# Patient Record
Sex: Female | Born: 1945 | Race: White | Hispanic: No | Smoking: Current every day smoker
Health system: Southern US, Community
[De-identification: ages and names within clinical notes are randomized; demographics above are authoritative.]

## PROBLEM LIST (undated history)

## (undated) HISTORY — PX: ABDOMINAL HYSTERECTOMY: SHX81

## (undated) HISTORY — PX: ORIF WRIST FRACTURE: SHX2133

---

## 2017-07-14 ENCOUNTER — Other Ambulatory Visit: Payer: Self-pay

## 2017-07-14 ENCOUNTER — Emergency Department (HOSPITAL_COMMUNITY)
Admission: EM | Admit: 2017-07-14 | Discharge: 2017-07-15 | Disposition: A | Discharge status: Home / Self Care | Payer: Medicare Other | Attending: Emergency Medicine | Admitting: Emergency Medicine

## 2017-07-14 ENCOUNTER — Emergency Department (HOSPITAL_COMMUNITY): Payer: Medicare Other

## 2017-07-14 ENCOUNTER — Encounter (HOSPITAL_COMMUNITY): Payer: Self-pay | Admitting: *Deleted

## 2017-07-14 DIAGNOSIS — Y9389 Activity, other specified: Secondary | ICD-10-CM | POA: Insufficient documentation

## 2017-07-14 DIAGNOSIS — Y998 Other external cause status: Secondary | ICD-10-CM | POA: Insufficient documentation

## 2017-07-14 DIAGNOSIS — W01198A Fall on same level from slipping, tripping and stumbling with subsequent striking against other object, initial encounter: Secondary | ICD-10-CM | POA: Diagnosis not present

## 2017-07-14 DIAGNOSIS — S5292XA Unspecified fracture of left forearm, initial encounter for closed fracture: Secondary | ICD-10-CM

## 2017-07-14 DIAGNOSIS — S6992XA Unspecified injury of left wrist, hand and finger(s), initial encounter: Secondary | ICD-10-CM | POA: Diagnosis present

## 2017-07-14 DIAGNOSIS — F17200 Nicotine dependence, unspecified, uncomplicated: Secondary | ICD-10-CM | POA: Insufficient documentation

## 2017-07-14 DIAGNOSIS — Y92002 Bathroom of unspecified non-institutional (private) residence single-family (private) house as the place of occurrence of the external cause: Secondary | ICD-10-CM | POA: Diagnosis not present

## 2017-07-14 MED ORDER — ACETAMINOPHEN 500 MG PO TABS
1000.0000 mg | ORAL_TABLET | Freq: Once | ORAL | Status: AC
  Administered 2017-07-14: 1000 mg via ORAL
  Filled 2017-07-14: qty 2

## 2017-07-14 NOTE — ED Provider Notes (Signed)
[facility] EMERGENCY DEPARTMENT Provider Note   CSN: [id number] Arrival date & time: 07/14/17  2207     History   Chief Complaint Chief Complaint  Patient presents with  . Fall    HPI Karla Christensen is a 72 y.o. female.  HPI  Patient presents after a fall in the shower.  States she just slipped and landed on her left wrist.  States the wrist was pointing out in the wrong direction.  Skin intact.  No numbness or weakness.  No elbow or shoulder pain.  Previously operated on his wrist 20 years ago.   History reviewed. No pertinent past medical history.  There are no active problems to display for this patient.   History reviewed.  Left wrist surgery around 25 years ago by someone in [place]   [place] History   None      Home Medications    Prior to Admission medications   Medication Sig Start Date End Date Taking? Authorizing Provider  ibuprofen (ADVIL,MOTRIN) 200 MG tablet Take 400 mg by mouth 2 (two) times daily as needed for mild pain or moderate pain.   Yes [provider]    Family History No family history on file.  Social History Social History   Tobacco Use  . Smoking status: Current Every Day Smoker  . Smokeless tobacco: Never Used  Substance Use Topics  . Alcohol use: Not Currently  . Drug use: Not on file     Allergies   Codeine   Review of Systems Review of Systems  Constitutional: Negative for appetite change.  Cardiovascular: Negative for chest pain.  Gastrointestinal: Negative for abdominal pain.  Musculoskeletal:       Left forearm pain and deformity  Skin: Negative for rash.  Neurological: Negative for weakness.     Physical Exam Updated Vital Signs BP 137/75   Pulse 98   Temp 97.8 F (36.6 C) (Oral)   Resp 20   Ht  (1.727 m)   Wt 100.2 kg (221 lb)   SpO2 97%   BMI 33.60 kg/m   Physical Exam  Constitutional: She appears well-developed.  HENT:  Head: Atraumatic.  Neck: Neck supple.    Cardiovascular: Normal rate.  Musculoskeletal: She exhibits tenderness.  Tenderness and mild deformity over left mid forearm.  No tenderness over elbow.  No tenderness over shoulder or wrist.  Neurovascular intact in left hand and has strong radial pulse.  Neurological: She is alert.  Skin: Skin is warm.     ED Treatments / Results  Labs (all labs ordered are listed, but only abnormal results are displayed) Labs Reviewed - No data to display  EKG None  Radiology Dg Forearm Left  Result Date: 07/14/2017 CLINICAL DATA:  Left forearm pain after slip and fall in the shower. EXAM: LEFT FOREARM - 2 VIEW COMPARISON:  None. FINDINGS: Acute, closed, transverse fracture through the mid ulnar diaphysis with 1 shaft width volar displacement of the distal fracture fragment and minimal comminution noted. Slight ulnar angulation of the distal fracture fragment is also seen. A nondisplaced transverse fracture through the distal radius at the junction of the middle and distal third, approximately 18 mm from the proximal margin of a volar fixation plate is also noted. No hardware loosening is apparent. No joint dislocations. IMPRESSION: Acute, closed, fractures of the mid ulna with 1 shaft with volar displacement of the distal fracture fragment and slight ulnar angulation. Similarly there is a fracture involving the diaphysis of the distal radius  at the junction of the proximal middle third with slight ulnar angulation of the distal fracture fragment. A pre-existing volar plate and screw fixation is noted of the distal radius without hardware failure nor fracture involvement identified. Electronically Signed   By: Tollie Eth M.D.   On: 07/14/2017 22:52    Procedures Procedures (including critical care time)  Medications Ordered in ED Medications  acetaminophen (TYLENOL) tablet 1,000 mg (1,000 mg Oral Given 07/14/17 2320)     Initial Impression / Assessment and Plan / ED Course  I have reviewed the  triage vital signs and the nursing notes.  Pertinent labs & imaging results that were available during my care of the patient were reviewed by me and considered in my medical decision making (see chart for details).     Patient with left forearm fracture after fall.  Skin intact.  Discussed with Dr. Romeo Apple, who will see the patient in consult tomorrow.  Discharge home after mobilization.  Final Clinical Impressions(s) / ED Diagnoses   Final diagnoses:  Closed fracture of left forearm, initial encounter    ED Discharge Orders    None       Benjiman Core, MD 07/14/17 2349

## 2017-07-14 NOTE — ED Triage Notes (Signed)
Pt states that slipped in the shower, landing on her left forearm, pt c/o pain to left forearm with deformity noted, pt arrived to er in splint, cms intact distal,

## 2017-07-14 NOTE — ED Notes (Signed)
Ice pack applied to left forearm,

## 2017-07-15 ENCOUNTER — Encounter (HOSPITAL_COMMUNITY): Payer: Self-pay

## 2017-07-15 ENCOUNTER — Encounter (HOSPITAL_COMMUNITY)
Admission: RE | Admit: 2017-07-15 | Discharge: 2017-07-15 | Disposition: A | Discharge status: Home / Self Care | Payer: Medicare Other | Source: Ambulatory Visit | Attending: Orthopedic Surgery | Admitting: Orthopedic Surgery

## 2017-07-15 ENCOUNTER — Other Ambulatory Visit: Payer: Self-pay

## 2017-07-15 ENCOUNTER — Ambulatory Visit (INDEPENDENT_AMBULATORY_CARE_PROVIDER_SITE_OTHER): Payer: Medicare Other | Admitting: Orthopedic Surgery

## 2017-07-15 VITALS — BP 147/82 | HR 86 | Ht 68.0 in | Wt 218.0 lb

## 2017-07-15 DIAGNOSIS — S52302A Unspecified fracture of shaft of left radius, initial encounter for closed fracture: Secondary | ICD-10-CM | POA: Diagnosis not present

## 2017-07-15 DIAGNOSIS — Y92002 Bathroom of unspecified non-institutional (private) residence single-family (private) house as the place of occurrence of the external cause: Secondary | ICD-10-CM | POA: Diagnosis not present

## 2017-07-15 DIAGNOSIS — S52202A Unspecified fracture of shaft of left ulna, initial encounter for closed fracture: Secondary | ICD-10-CM

## 2017-07-15 DIAGNOSIS — Y93E1 Activity, personal bathing and showering: Secondary | ICD-10-CM | POA: Diagnosis not present

## 2017-07-15 DIAGNOSIS — Z885 Allergy status to narcotic agent status: Secondary | ICD-10-CM | POA: Diagnosis not present

## 2017-07-15 DIAGNOSIS — Z01818 Encounter for other preprocedural examination: Secondary | ICD-10-CM

## 2017-07-15 DIAGNOSIS — F17210 Nicotine dependence, cigarettes, uncomplicated: Secondary | ICD-10-CM | POA: Diagnosis not present

## 2017-07-15 LAB — CBC WITH DIFFERENTIAL/PLATELET
BASOS ABS: 0 10*3/uL (ref 0.0–0.1)
Basophils Relative: 0 %
Eosinophils Absolute: 0.1 10*3/uL (ref 0.0–0.7)
Eosinophils Relative: 1 %
HEMATOCRIT: 45.2 % (ref 36.0–46.0)
Hemoglobin: 14.3 g/dL (ref 12.0–15.0)
LYMPHS ABS: 2.3 10*3/uL (ref 0.7–4.0)
Lymphocytes Relative: 25 %
MCH: 28.8 pg (ref 26.0–34.0)
MCHC: 31.6 g/dL (ref 30.0–36.0)
MCV: 91.1 fL (ref 78.0–100.0)
Monocytes Absolute: 0.7 10*3/uL (ref 0.1–1.0)
Monocytes Relative: 7 %
NEUTROS ABS: 6.1 10*3/uL (ref 1.7–7.7)
Neutrophils Relative %: 67 %
Platelets: 250 10*3/uL (ref 150–400)
RBC: 4.96 MIL/uL (ref 3.87–5.11)
RDW: 13.3 % (ref 11.5–15.5)
WBC: 9.1 10*3/uL (ref 4.0–10.5)

## 2017-07-15 LAB — COMPREHENSIVE METABOLIC PANEL
ALK PHOS: 107 U/L (ref 38–126)
ALT: 12 U/L — AB (ref 14–54)
AST: 12 U/L — AB (ref 15–41)
Albumin: 4 g/dL (ref 3.5–5.0)
Anion gap: 7 (ref 5–15)
BILIRUBIN TOTAL: 1.3 mg/dL — AB (ref 0.3–1.2)
BUN: 13 mg/dL (ref 6–20)
CALCIUM: 9 mg/dL (ref 8.9–10.3)
CO2: 21 mmol/L — ABNORMAL LOW (ref 22–32)
CREATININE: 0.83 mg/dL (ref 0.44–1.00)
Chloride: 111 mmol/L (ref 101–111)
GFR calc Af Amer: >60 mL/min (ref >60)
GFR calc non Af Amer: >60 mL/min (ref >60)
Glucose, Bld: 104 mg/dL — ABNORMAL HIGH (ref 65–99)
Potassium: 4 mmol/L (ref 3.5–5.1)
Sodium: 139 mmol/L (ref 135–145)
TOTAL PROTEIN: 7.2 g/dL (ref 6.5–8.1)

## 2017-07-15 NOTE — Patient Instructions (Signed)
Karla Christensen  07/15/2017     @   Your procedure is scheduled on  07/16/2017   Report to [facility] at  1100   A.M.  Call this number if you have problems the morning of surgery:  [phone]   Remember:  Do not eat food or drink liquids after midnight.  Take these medicines the morning of surgery with A SIP OF WATER  None   Do not wear jewelry, make-up or nail polish.  Do not wear lotions, powders, or perfumes, or deodorant.  Do not shave 48 hours prior to surgery.  Men may shave face and neck.  Do not bring valuables to the hospital.  [facility] is not responsible for any belongings or valuables.  Contacts, dentures or bridgework may not be worn into surgery.  Leave your suitcase in the car.  After surgery it may be brought to your room.  For patients admitted to the hospital, discharge time will be determined by your treatment team.  Patients discharged the day of surgery will not be allowed to drive home.   Name and phone number of your driver:   family Special instructions:  None  Please read over the following fact sheets that you were given. Pain Booklet, Coughing and Deep Breathing, Anesthesia Post-op Instructions and Care and Recovery After Surgery       Radial Head Fracture A radial head fracture is a break (fracture) in the smaller bone in your forearm (radius). There are two bones in your forearm. The radius, or radial bone, is the bone on the side of your thumb. The break is at the head of the bone, which is at the elbow joint. These breaks usually happen because of an injury, such as falling on your arm when you are reaching out with it. Follow these instructions at home: If you have a splint or sling:  Wear the splint or sling as told by your doctor. Remove it only as told by your doctor.  Loosen the splint if your fingers tingle, become numb, or turn cold and blue.  Keep the splint or sling clean and dry. If you have a  cast:  Do not stick anything inside the cast to scratch your skin.  Check the skin around the cast every day. Tell your doctor about any concerns.  You may put lotion on dry skin around the edges of the cast. Do not put lotion on the skin under the cast. Bathing  Do not take baths, swim, or use a hot tub until your doctor says it is okay. Ask your doctor if you can take showers. You may only be allowed to take sponge baths for bathing.  If your doctor says it is okay for you to take baths or showers, you may need to cover the cast or splint with a plastic bag to protect it from water. Managing pain, stiffness, and swelling  If directed, put ice on the injured area. ? Put ice in a plastic bag. ? Place a towel between your skin and the bag. ? Leave the ice on for 20 minutes, 2-3 times a day.  Move your fingers often to avoid stiffness and to lessen swelling.  Raise (elevate) the injured area above the level of your heart while you are sitting or lying down. Driving  Do not drive or use heavy machinery while taking prescription pain medicine.  Ask your doctor when it is safe to drive if you have  a cast, splint, or sling on your arm. Activity  Return to your normal activities as told by your doctor. Ask your doctor what activities are safe for you.  Do exercises as told by your doctor or physical therapist (PT). General instructions  Take over-the-counter and prescription medicines only as told by your doctor.  Do not put pressure on any part of the cast or splint until it is fully hardened. This may take many hours.  Do not use any tobacco products, such as cigarettes, chewing tobacco, and e-cigarettes. Tobacco can delay bone healing. If you need help quitting, ask your doctor.  Keep all follow-up visits as told by your doctor. This is important. Contact a doctor if:  You have problems with your cast or splint.  You have pain or swelling that gets worse. Get help right away  if:  You have very bad pain when you stretch your fingers.  You have fluid or a bad smell coming from your splint.  Your hand or fingers get cold or turn pale or blue.  You lose feeling in any part of your hand or arm. This information is not intended to replace advice given to you by your health care provider. Make sure you discuss any questions you have with your health care provider. Document Released: 02/11/2009 Document Revised: 08/01/2015 Document Reviewed: 09/05/2014 Elsevier Interactive Patient Education  2018 [employer] Radial Fracture A radial fracture is a break in the radius bone, which is the long bone of the forearm that is on the same side as your thumb. Your forearm is the part of your arm that is between your elbow and your wrist. It is made up of two bones: the radius and the ulna. Most radial fractures occur near the wrist (distal radialfracture) or near the elbow (radial head fracture). A distal radial fracture is the most common type of broken arm. This fracture usually occurs about an inch above the wrist. Fractures of the middle part of the bone are less common. What are the causes? Falling with your arm outstretched is the most common cause of a radial fracture. Other causes include:  Car accidents.  Bike accidents.  A direct blow to the middle part of the radius.  What increases the risk?  You may be at greater risk for a distal radial fracture if you are 72 years of age or older.  You may be at greater risk for a radial head fracture if you are: ? Female. ? 72-64 years old.  You may be at a greater risk for all types of radial fractures if you have a condition that causes your bones to be weak or thin (osteoporosis). What are the signs or symptoms? A radial fracture causes pain immediately after the injury. Other signs and symptoms include:  An abnormal bend or bump in your arm (deformity).  Swelling.  Bruising.  Numbness or  tingling.  Tenderness.  Limited movement.  How is this diagnosed? Your health care provider may diagnose a radial fracture based on:  Your symptoms.  Your medical history, including any recent injury.  A physical exam. Your health care provider will look for any deformity and feel for tenderness over the break. Your health care provider will also check whether the bone is out of place.  An X-ray exam to confirm the diagnosis and learn more about the type of fracture.  How is this treated? The goals of treatment are to get the bone in proper position for healing and  to keep it from moving so it will heal over time. Your treatment will depend on many factors, especially the type of fracture that you have.  If the fractured bone: ? Is in the correct position (nondisplaced), you may only need to wear a cast or a splint. ? Has a slightly displaced fracture, you may need to have the bones moved back into place manually (closed reduction) before the splint or cast is put on.  You may have a temporary splint before you have a plaster cast. The splint allows room for some swelling. After a few days, a cast can replace the splint. ? You may have to wear the cast for about 6 weeks or as directed by your health care provider. ? The cast may be changed after about 3 weeks or as directed by your health care provider.  After your cast is taken off, you may need physical therapy to regain full movement in your wrist or elbow.  You may need emergency surgery if you have: ? A fractured bone that is out of position (displaced). ? A fracture with multiple fragments (comminuted fracture). ? A fracture that breaks the skin (open fracture). This type of fracture may require surgical wires, plates, or screws to hold the bone in place.  You may have X-rays every couple of weeks to check on your healing.  Follow these instructions at home:  Keep the injured arm above the level of your heart while you  are sitting or lying down. This helps to reduce swelling and pain.  Apply ice to the injured area: ? Put ice in a plastic bag. ? Place a towel between your skin and the bag. ? Leave the ice on for 20 minutes, 2-3 times per day.  Move your fingers often to avoid stiffness and to minimize swelling.  If you have a plaster or fiberglass cast: ? Do not try to scratch the skin under the cast using sharp or pointed objects. ? Check the skin around the cast every day. You may put lotion on any red or sore areas. ? Keep your cast dry and clean.  If you have a plaster splint: ? Wear the splint as directed. ? Loosen the elastic around the splint if your fingers become numb and tingle, or if they turn cold and blue.  Do not put pressure on any part of your cast until it is fully hardened. Rest your cast only on a pillow for the first 24 hours.  Protect your cast or splint while bathing or showering, as directed by your health care provider. Do not put your cast or splint into water.  Take medicines only as directed by your health care provider.  Return to activities, such as sports, as directed by your health care provider. Ask your health care provider what activities are safe for you.  Keep all follow-up visits as directed by your health care provider. This is important. Contact a health care provider if:  Your pain medicine is not helping.  Your cast gets damaged or it breaks.  Your cast becomes loose.  Your cast gets wet.  You have more severe pain or swelling than you did before the cast.  You have severe pain when stretching your fingers.  You continue to have pain or stiffness in your elbow or your wrist after your cast is taken off. Get help right away if:  You cannot move your fingers.  You lose feeling in your fingers or your hand.  Your  hand or your fingers turn cold and pale or blue.  You notice a bad smell coming from your cast.  You have drainage from underneath  your cast.  You have new stains from blood or drainage seeping through your cast. This information is not intended to replace advice given to you by your health care provider. Make sure you discuss any questions you have with your health care provider. Document Released: 08/06/2005 Document Revised: 07/30/2015 Document Reviewed: 08/18/2013 Elsevier Interactive Patient Education  [employer].

## 2017-07-15 NOTE — Progress Notes (Signed)
NEW PATIENT OFFICE VISIT   Chief Complaint  Patient presents with  . Arm Injury    forearm fracture left fell 07/14/17     MEDICAL DECISION SECTION  xrays ordered? no  My independent reading of xrays: I read x-rays of the left forearm.  There is a previous Lee placed plate volar aspect left distal radius.  Fracture occurs in the radius and fourth and ulna midshaft forearm.  The fracture of the radius is just proximal to the radius.   Encounter Diagnosis  Name Primary?  . Closed fracture of shaft of left radius and ulna, initial encounter Yes     PLAN:  The patient has been counseled on the fact that she will have to have the plate removed most likely and then to new plates placed along with the risks and benefits of neurovascular tendon injury  She is given informed consent  Plan for open treatment internal fixation left forearm radius and ulna with possible hardware removal    Chief Complaint  Patient presents with  . Arm Injury    forearm fracture left fell 07/14/17    72 year old female with history of previous open treatment internal fixation left distal radius volar plating she says 20 years ago fell in her bathtub on May 8 comes to the office after ER visit last night with a both bones forearm fracture.  She complains of minimal discomfort over the left distal to mid forearm with increased pain with movement of the hand and wrist with associated symptoms of swelling but no numbness or tingling   Review of Systems  Respiratory: Positive for cough.   All other systems reviewed and are negative.    No past medical history on file.  Past Surgical History:  Procedure Laterality Date  . ABDOMINAL HYSTERECTOMY    . ORIF WRIST FRACTURE Left     No family history on file.  She denies any major family medical illnesses no anesthetic issues no bleeding issues Social History   Tobacco Use  . Smoking status: Current Every Day Smoker    Packs/day: 1.00    Years:  50.00    Pack years: 50.00    Types: Cigarettes  . Smokeless tobacco: Never Used  Substance Use Topics  . Alcohol use: Not Currently  . Drug use: Never   Allergies  Allergen Reactions  . Codeine     Gi upset     No outpatient medications have been marked as taking for the 07/15/17 encounter (Office Visit) with Vickki Hearing, MD.    BP (!) 147/82   Pulse 86   Ht  (1.727 m)   Wt 218 lb (98.9 kg)   BMI 33.15 kg/m   Physical Exam  Constitutional: She is oriented to person, place, and time. She appears well-developed and well-nourished.  Neurological: She is alert and oriented to person, place, and time.  Psychiatric: She has a normal mood and affect. Judgment normal.  Vitals reviewed.   Ortho Exam   Right upper extremity examined for comparison  Normal gross alignment full range of motion no crepitation contracture.  Stability and strength test normal without subluxation or laxity.  No atrophy or abnormal movements such as tremor.  Skin warm dry and intact no palpable subcutaneous lesions.  Reflexes are intact pulses and perfusion are normal she is oriented x3 mood and affect are normal  Left upper extremity skin is intact previous incision is on the volar aspect of the wrist there is minimal swelling.  No joint dislocation subluxation no abnormal muscle tone grip strength is actually pretty good tender over the midshaft of the radius and ulna with normal alignment  Gait is normal

## 2017-07-16 ENCOUNTER — Ambulatory Visit (HOSPITAL_COMMUNITY)
Admission: RE | Admit: 2017-07-16 | Discharge: 2017-07-16 | Disposition: A | Discharge status: Home / Self Care | Payer: Medicare Other | Source: Ambulatory Visit | Attending: Orthopedic Surgery | Admitting: Orthopedic Surgery

## 2017-07-16 ENCOUNTER — Ambulatory Visit (HOSPITAL_COMMUNITY): Payer: Medicare Other | Admitting: Anesthesiology

## 2017-07-16 ENCOUNTER — Ambulatory Visit (HOSPITAL_COMMUNITY): Payer: Medicare Other

## 2017-07-16 ENCOUNTER — Encounter (HOSPITAL_COMMUNITY)
Admission: RE | Disposition: A | Discharge status: Home / Self Care | Payer: Self-pay | Source: Ambulatory Visit | Attending: Orthopedic Surgery

## 2017-07-16 ENCOUNTER — Encounter (HOSPITAL_COMMUNITY): Payer: Self-pay | Admitting: *Deleted

## 2017-07-16 DIAGNOSIS — S52302A Unspecified fracture of shaft of left radius, initial encounter for closed fracture: Secondary | ICD-10-CM | POA: Diagnosis not present

## 2017-07-16 DIAGNOSIS — T148XXA Other injury of unspecified body region, initial encounter: Secondary | ICD-10-CM

## 2017-07-16 DIAGNOSIS — Y92002 Bathroom of unspecified non-institutional (private) residence single-family (private) house as the place of occurrence of the external cause: Secondary | ICD-10-CM | POA: Insufficient documentation

## 2017-07-16 DIAGNOSIS — F17210 Nicotine dependence, cigarettes, uncomplicated: Secondary | ICD-10-CM | POA: Diagnosis not present

## 2017-07-16 DIAGNOSIS — Y93E1 Activity, personal bathing and showering: Secondary | ICD-10-CM | POA: Insufficient documentation

## 2017-07-16 DIAGNOSIS — S52202A Unspecified fracture of shaft of left ulna, initial encounter for closed fracture: Secondary | ICD-10-CM

## 2017-07-16 DIAGNOSIS — Z885 Allergy status to narcotic agent status: Secondary | ICD-10-CM | POA: Insufficient documentation

## 2017-07-16 HISTORY — PX: ORIF RADIAL FRACTURE: SHX5113

## 2017-07-16 HISTORY — PX: HARDWARE REMOVAL: SHX979

## 2017-07-16 SURGERY — OPEN REDUCTION INTERNAL FIXATION (ORIF) RADIAL FRACTURE
Anesthesia: General | Site: Wrist | Laterality: Left

## 2017-07-16 MED ORDER — BUPIVACAINE-EPINEPHRINE (PF) 0.5% -1:200000 IJ SOLN
INTRAMUSCULAR | Status: DC | PRN
  Administered 2017-07-16: 30 mL via PERINEURAL

## 2017-07-16 MED ORDER — PROPOFOL 10 MG/ML IV BOLUS
INTRAVENOUS | Status: AC
  Filled 2017-07-16: qty 20

## 2017-07-16 MED ORDER — CEFAZOLIN SODIUM-DEXTROSE 2-4 GM/100ML-% IV SOLN
2.0000 g | INTRAVENOUS | Status: AC
  Administered 2017-07-16: 2 g via INTRAVENOUS

## 2017-07-16 MED ORDER — LIDOCAINE HCL (CARDIAC) PF 50 MG/5ML IV SOSY
PREFILLED_SYRINGE | INTRAVENOUS | Status: DC | PRN
  Administered 2017-07-16: 40 mg via INTRAVENOUS

## 2017-07-16 MED ORDER — ONDANSETRON HCL 4 MG/2ML IJ SOLN
INTRAMUSCULAR | Status: AC
  Filled 2017-07-16: qty 2

## 2017-07-16 MED ORDER — POVIDONE-IODINE 10 % EX SWAB: 2.0000 "application " | Freq: Once | CUTANEOUS | Status: DC

## 2017-07-16 MED ORDER — BUPIVACAINE-EPINEPHRINE (PF) 0.5% -1:200000 IJ SOLN
INTRAMUSCULAR | Status: AC
  Filled 2017-07-16: qty 30

## 2017-07-16 MED ORDER — HYDROCODONE-ACETAMINOPHEN 7.5-325 MG PO TABS
1.0000 | ORAL_TABLET | Freq: Once | ORAL | Status: AC
  Administered 2017-07-16: 1 via ORAL

## 2017-07-16 MED ORDER — FENTANYL CITRATE (PF) 100 MCG/2ML IJ SOLN
INTRAMUSCULAR | Status: DC | PRN
  Administered 2017-07-16: 50 ug via INTRAVENOUS
  Administered 2017-07-16: 25 ug via INTRAVENOUS
  Administered 2017-07-16 (×3): 50 ug via INTRAVENOUS

## 2017-07-16 MED ORDER — ONDANSETRON HCL 4 MG/2ML IJ SOLN
INTRAMUSCULAR | Status: AC
Start: 2017-07-16 — End: ?
  Filled 2017-07-16: qty 2

## 2017-07-16 MED ORDER — MIDAZOLAM HCL 5 MG/5ML IJ SOLN
INTRAMUSCULAR | Status: DC | PRN
  Administered 2017-07-16 (×2): 1 mg via INTRAVENOUS

## 2017-07-16 MED ORDER — LIDOCAINE HCL (PF) 1 % IJ SOLN
INTRAMUSCULAR | Status: AC
  Filled 2017-07-16: qty 5

## 2017-07-16 MED ORDER — HYDROCODONE-ACETAMINOPHEN 7.5-325 MG PO TABS
ORAL_TABLET | ORAL | Status: AC
  Filled 2017-07-16: qty 1

## 2017-07-16 MED ORDER — FENTANYL CITRATE (PF) 100 MCG/2ML IJ SOLN
25.0000 ug | INTRAMUSCULAR | Status: DC | PRN
  Administered 2017-07-16 (×2): 50 ug via INTRAVENOUS
  Filled 2017-07-16: qty 2

## 2017-07-16 MED ORDER — KETOROLAC TROMETHAMINE 30 MG/ML IJ SOLN
30.0000 mg | Freq: Once | INTRAMUSCULAR | Status: AC
  Administered 2017-07-16: 30 mg via INTRAVENOUS

## 2017-07-16 MED ORDER — LACTATED RINGERS IV SOLN
INTRAVENOUS | Status: DC
  Administered 2017-07-16: 15:00:00 via INTRAVENOUS
  Administered 2017-07-16: 1000 mL via INTRAVENOUS

## 2017-07-16 MED ORDER — CEFAZOLIN SODIUM-DEXTROSE 2-4 GM/100ML-% IV SOLN
INTRAVENOUS | Status: AC
  Filled 2017-07-16: qty 100

## 2017-07-16 MED ORDER — CHLORHEXIDINE GLUCONATE 4 % EX LIQD: 60.0000 mL | Freq: Once | CUTANEOUS | Status: DC

## 2017-07-16 MED ORDER — EPHEDRINE SULFATE 50 MG/ML IJ SOLN
INTRAMUSCULAR | Status: DC | PRN
  Administered 2017-07-16: 5 mg via INTRAVENOUS
  Administered 2017-07-16: 10 mg via INTRAVENOUS

## 2017-07-16 MED ORDER — TRAMADOL-ACETAMINOPHEN 37.5-325 MG PO TABS: 1.0000 | ORAL_TABLET | ORAL | 0 refills | Status: AC | PRN

## 2017-07-16 MED ORDER — ONDANSETRON HCL 4 MG/2ML IJ SOLN: 4.0000 mg | Freq: Once | INTRAMUSCULAR | Status: DC

## 2017-07-16 MED ORDER — FENTANYL CITRATE (PF) 250 MCG/5ML IJ SOLN
INTRAMUSCULAR | Status: AC
  Filled 2017-07-16: qty 5

## 2017-07-16 MED ORDER — SUCCINYLCHOLINE 20MG/ML (10ML) SYRINGE FOR MEDFUSION PUMP - OPTIME
INTRAMUSCULAR | Status: DC | PRN
  Administered 2017-07-16: 120 mg via INTRAVENOUS

## 2017-07-16 MED ORDER — SODIUM CHLORIDE 0.9 % IR SOLN
Status: DC | PRN
  Administered 2017-07-16: 1000 mL

## 2017-07-16 MED ORDER — KETOROLAC TROMETHAMINE 30 MG/ML IJ SOLN
INTRAMUSCULAR | Status: AC
  Filled 2017-07-16: qty 1

## 2017-07-16 MED ORDER — PROPOFOL 10 MG/ML IV BOLUS
INTRAVENOUS | Status: DC | PRN
  Administered 2017-07-16: 160 mg via INTRAVENOUS

## 2017-07-16 MED ORDER — SUCCINYLCHOLINE CHLORIDE 20 MG/ML IJ SOLN
INTRAMUSCULAR | Status: AC
  Filled 2017-07-16: qty 1

## 2017-07-16 MED ORDER — ROCURONIUM 10MG/ML (10ML) SYRINGE FOR MEDFUSION PUMP - OPTIME
INTRAVENOUS | Status: DC | PRN
  Administered 2017-07-16: 6 mg via INTRAVENOUS

## 2017-07-16 MED ORDER — FENTANYL CITRATE (PF) 100 MCG/2ML IJ SOLN
INTRAMUSCULAR | Status: AC
  Filled 2017-07-16: qty 2

## 2017-07-16 SURGICAL SUPPLY — 55 items
BANDAGE ACE 4 STERILE (GAUZE/BANDAGES/DRESSINGS) ×6 IMPLANT
BANDAGE ELASTIC 3 VELCRO ST LF (GAUZE/BANDAGES/DRESSINGS) IMPLANT
BANDAGE ESMARK 4X12 BL STRL LF (DISPOSABLE) ×1 IMPLANT
BIT DRILL 2.5X125 (BIT) ×3 IMPLANT
BLADE 10 SAFETY STRL DISP (BLADE) ×3 IMPLANT
BNDG COHESIVE 4X5 TAN STRL (GAUZE/BANDAGES/DRESSINGS) ×3 IMPLANT
BNDG ESMARK 4X12 BLUE STRL LF (DISPOSABLE) ×3
CHLORAPREP W/TINT 26ML (MISCELLANEOUS) ×3 IMPLANT
CLOTH BEACON ORANGE TIMEOUT ST (SAFETY) ×3 IMPLANT
COVER LIGHT HANDLE STERIS (MISCELLANEOUS) ×6 IMPLANT
COVER PROBE W GEL 5X96 (DRAPES) ×3 IMPLANT
CUFF TOURNIQUET SINGLE 18IN (TOURNIQUET CUFF) ×3 IMPLANT
DRAPE C-ARM FOLDED MOBILE STRL (DRAPES) ×3 IMPLANT
DRAPE PROXIMA HALF (DRAPES) ×3 IMPLANT
DRESSING XEROFORM 5X9 (GAUZE/BANDAGES/DRESSINGS) ×3 IMPLANT
DRSG XEROFORM 1X8 (GAUZE/BANDAGES/DRESSINGS) IMPLANT
ELECT REM PT RETURN 9FT ADLT (ELECTROSURGICAL) ×3
ELECTRODE REM PT RTRN 9FT ADLT (ELECTROSURGICAL) ×1 IMPLANT
GAUZE KERLIX 2X3 DERM STRL LF (GAUZE/BANDAGES/DRESSINGS) ×3 IMPLANT
GAUZE SPONGE 4X4 12PLY STRL (GAUZE/BANDAGES/DRESSINGS) ×3 IMPLANT
GAUZE XEROFORM 5X9 LF (GAUZE/BANDAGES/DRESSINGS) ×3 IMPLANT
GLOVE BIOGEL PI IND STRL 7.0 (GLOVE) ×1 IMPLANT
GLOVE BIOGEL PI INDICATOR 7.0 (GLOVE) ×2
GLOVE SKINSENSE NS SZ8.0 LF (GLOVE) ×2
GLOVE SKINSENSE STRL SZ8.0 LF (GLOVE) ×1 IMPLANT
GLOVE SS N UNI LF 8.5 STRL (GLOVE) ×3 IMPLANT
GOWN STRL REUS W/TWL LRG LVL3 (GOWN DISPOSABLE) ×6 IMPLANT
GOWN STRL REUS W/TWL XL LVL3 (GOWN DISPOSABLE) ×3 IMPLANT
KIT TURNOVER KIT A (KITS) ×3 IMPLANT
MANIFOLD NEPTUNE II (INSTRUMENTS) ×3 IMPLANT
NEEDLE HYPO 21X1.5 SAFETY (NEEDLE) ×3 IMPLANT
NS IRRIG 1000ML POUR BTL (IV SOLUTION) ×3 IMPLANT
PACK BASIC LIMB (CUSTOM PROCEDURE TRAY) ×3 IMPLANT
PAD ABD 5X9 TENDERSORB (GAUZE/BANDAGES/DRESSINGS) ×6 IMPLANT
PAD ARMBOARD 7.5X6 YLW CONV (MISCELLANEOUS) ×3 IMPLANT
PADDING WEBRIL 4 STERILE (GAUZE/BANDAGES/DRESSINGS) ×6 IMPLANT
PROS LCP PLATE 6H 85MM (Plate) ×6 IMPLANT
PROSTHESIS LCP PLATE 6H 85MM (Plate) ×2 IMPLANT
SCREW CORTEX 3.5 14MM (Screw) ×12 IMPLANT
SCREW CORTEX 3.5 16MM (Screw) ×10 IMPLANT
SCREW LOCK CORT ST 3.5X14 (Screw) ×6 IMPLANT
SCREW LOCK CORT ST 3.5X16 (Screw) ×5 IMPLANT
SCREW LOCK T15 FT 16X3.5X2.9X (Screw) ×1 IMPLANT
SCREW LOCKING 3.5X16 (Screw) ×2 IMPLANT
SET BASIN LINEN APH (SET/KITS/TRAYS/PACK) ×3 IMPLANT
SPLINT J IMMOBILIZER 4X20FT (CAST SUPPLIES) ×1 IMPLANT
SPLINT J PLASTER J 4INX20Y (CAST SUPPLIES) ×2
SPONGE GAUZE 4X4 12PLY STER LF (GAUZE/BANDAGES/DRESSINGS) ×3 IMPLANT
STAPLER VISISTAT 35W (STAPLE) ×3 IMPLANT
SUT ETHILON 3 0 FSL (SUTURE) IMPLANT
SUT MON AB 2-0 SH 27 (SUTURE) ×6
SUT MON AB 2-0 SH27 (SUTURE) ×3 IMPLANT
SYR BULB IRRIGATION 50ML (SYRINGE) ×3 IMPLANT
SYRINGE 10CC LL (SYRINGE) ×3 IMPLANT
WATER STERILE IRR 1000ML POUR (IV SOLUTION) ×6 IMPLANT

## 2017-07-16 NOTE — Anesthesia Postprocedure Evaluation (Signed)
Anesthesia Post Note  Patient: Karla Christensen  Procedure(s) Performed: OPEN REDUCTION INTERNAL FIXATION (ORIF) RADIAL FRACTURE and ulna shaft//both bones forearm and hardware removal (Left Wrist) HARDWARE REMOVAL (Left Wrist)  Patient location during evaluation: PACU Anesthesia Type: General Level of consciousness: awake and alert, oriented and patient cooperative Pain management: satisfactory to patient Vital Signs Assessment: post-procedure vital signs reviewed and stable Respiratory status: spontaneous breathing Cardiovascular status: stable Postop Assessment: no apparent nausea or vomiting Anesthetic complications: no     Last Vitals:  Vitals:   07/16/17 1515 07/16/17 1530  BP: (!) 143/74 137/85  Pulse: (!) 25 86  Resp: (!) 27 19  Temp: 36.6 C   SpO2: 91%     Last Pain:  Vitals:   07/16/17 1515  TempSrc:   PainSc: 0-No pain                 Montavis Schubring

## 2017-07-16 NOTE — Anesthesia Procedure Notes (Signed)
Procedure Name: Intubation Date/Time: 07/16/2017 1:30 PM Performed by: Ollen Bowl, CRNA Pre-anesthesia Checklist: Patient identified, Patient being monitored, Timeout performed, Emergency Drugs available and Suction available Patient Re-evaluated:Patient Re-evaluated prior to induction Oxygen Delivery Method: Circle system utilized Preoxygenation: Pre-oxygenation with 100% oxygen Induction Type: IV induction Ventilation: Mask ventilation without difficulty Laryngoscope Size: Mac and 3 Grade View: Grade I Tube type: Oral Tube size: 7.0 mm Number of attempts: 1 Airway Equipment and Method: Stylet Placement Confirmation: ETT inserted through vocal cords under direct vision,  positive ETCO2 and breath sounds checked- equal and bilateral Secured at: 21 cm Tube secured with: Tape Dental Injury: Teeth and Oropharynx as per pre-operative assessment

## 2017-07-16 NOTE — Anesthesia Preprocedure Evaluation (Signed)
Anesthesia Evaluation  Patient identified by MRN, date of birth, ID band Patient awake    Reviewed: Allergy & Precautions, NPO status , Patient's Chart, lab work & pertinent test results  Airway Mallampati: II  TM Distance: >3 FB Neck ROM: Full    Dental no notable dental hx.    Pulmonary neg pulmonary ROS, Current Smoker,  Can do 4 mets  States DOE secondary to smoking history  At least 1/2 ppd x 50+ years    Pulmonary exam normal breath sounds clear to auscultation       Cardiovascular Exercise Tolerance: Good negative cardio ROS Normal cardiovascular examI Rhythm:Regular Rate:Normal     Neuro/Psych negative neurological ROS  negative psych ROS   GI/Hepatic negative GI ROS, Neg liver ROS,   Endo/Other  negative endocrine ROS  Renal/GU negative Renal ROS  negative genitourinary   Musculoskeletal negative musculoskeletal ROS (+)   Abdominal   Peds negative pediatric ROS (+)  Hematology negative hematology ROS (+)   Anesthesia Other Findings   Reproductive/Obstetrics negative OB ROS                             Anesthesia Physical Anesthesia Plan  ASA: II  Anesthesia Plan: General   Post-op Pain Management:    Induction: Intravenous  PONV Risk Score and Plan: Ondansetron  Airway Management Planned: Oral ETT  Additional Equipment:   Intra-op Plan:   Post-operative Plan: Extubation in OR  Informed Consent: I have reviewed the patients History and Physical, chart, labs and discussed the procedure including the risks, benefits and alternatives for the proposed anesthesia with the patient or authorized representative who has indicated his/her understanding and acceptance.     Plan Discussed with: CRNA  Anesthesia Plan Comments:         Anesthesia Quick Evaluation

## 2017-07-16 NOTE — Op Note (Addendum)
07/16/2017  3:03 PM  PATIENT:  Karla Christensen  72 y.o. female  PRE-OPERATIVE DIAGNOSIS:  left both bone forearm fracture  POST-OPERATIVE DIAGNOSIS:  left both bone forearm fracture  PROCEDURE:  Procedure(s): OPEN REDUCTION INTERNAL FIXATION (ORIF) RADIAL FRACTURE and ulna shaft//both bones forearm and hardware removal (Left) HARDWARE REMOVAL (Left)   Implants Synthes LC DCP 6 hole on the radius and ulna  1 of the screws is a locking screw on the radial side, all of the other screws are cortical screws  We removed an old Synthes plate  Operative findings both bone forearm fracture left upper extremity forearm, the fracture on the radius was too close to the plate to leave the plate in place.  1 of the screws in the proximal portion of the plate was loose  Details of procedure the patient was identified in the preop area the surgical site was marked the chart was reviewed.  The patient was taken to surgery she had general anesthesia with intubation she was in the supine position the left hand and arm was on an armboard a tourniquet was on the left upper arm.  The arm was prepped and draped sterilely timeout was completed  The limb was exsanguinated with a 4 inch Esmarch the tourniquet was elevated to 250 mmHg  I marked out the old incision which seemed to be more midline and made a true volar approach of Sherilyn Cooter of the entire radius.  I divided subcutaneous tissue and then blunt dissection was carried out to the plate was exposed.  I used osteotomes to remove bone that had grown over the plate followed by removal of the screws and then removal of the plate  I then reduced the radius with 2 bone clamps and applied a slightly contoured plate using AO technique.  I applied 3 screws and then took x-ray x-rays showed reduction of the radius.  I then turned my attention to the ulna I made a dorsal approach of Thompson expose the ulna with subperiosteal dissection and then applied a plate starting  on the oblique side of the fracture and then loaded the opposite side and then took another x-ray both fractures were anatomically reduced I finished the screws on the ulna with a 6 hole plate and then turned my attention to the radius I did use 1 locking screw.  I use this screw in order to avoid opening another set as all of the screws in the 14 and 16 length had been used.  Final imaging study showed AP and lateral x-ray which showed reduction of the fracture anatomically hardware in good position and appropriate length  Wounds were irrigated and closed with 2-0 Monocryl in interrupted fashion followed by staples.  I did inject 15 cc on each incision using Marcaine.  Splint was applied volarly after the tourniquet was released  I got good color and capillary refill in the hand after the tourniquet was released  Postop plan Splint for 2 weeks At 2 weeks sutures come out staples come out x-rays are taken Forearm splint for the next 10 weeks  SURGEON:  Surgeon(s) and Role:    * Vickki Hearing, MD - Primary  PHYSICIAN ASSISTANT:   ASSISTANTS: [place]  ANESTHESIA:   general  EBL:  0 mL   BLOOD ADMINISTERED:none  DRAINS: none   LOCAL MEDICATIONS USED:  MARCAINE     SPECIMEN:  No Specimen  DISPOSITION OF SPECIMEN:  N/A  COUNTS:  YES  TOURNIQUET:  * Missing tourniquet  times found for documented tourniquets in log: [id number] *  DICTATION: .Dragon Dictation  PLAN OF CARE: Discharge to home after PACU  PATIENT DISPOSITION:  PACU - hemodynamically stable.   Delay start of Pharmacological VTE agent (>24hrs) due to surgical blood loss or risk of bleeding: not applicable

## 2017-07-16 NOTE — H&P (Signed)
Chief Complaint  Patient presents with  . Arm Injury      forearm fracture left fell 07/14/17        MEDICAL DECISION SECTION  xrays ordered? no   My independent reading of xrays: I read x-rays of the left forearm.  There is a previous Lee placed plate volar aspect left distal radius.  Fracture occurs in the radius and fourth and ulna midshaft forearm.  The fracture of the radius is just proximal to the radius.         Encounter Diagnosis  Name Primary?  . Closed fracture of shaft of left radius and ulna, initial encounter Yes        PLAN:  The patient has been counseled on the fact that she will have to have the plate removed most likely and then to new plates placed along with the risks and benefits of neurovascular tendon injury   She has given informed consent   Plan for open treatment internal fixation left forearm radius and ulna with possible hardware removal           Chief Complaint  Patient presents with  . Arm Injury      forearm fracture left fell 07/14/17      72 year old female with history of previous open treatment internal fixation left distal radius volar plating she says 20 years ago fell in her bathtub on May 8 comes to the office after ER visit last night with a both bones forearm fracture.   She complains of minimal discomfort over the left distal to mid forearm with increased pain with movement of the hand and wrist with associated symptoms of swelling but no numbness or tingling     Review of Systems  Respiratory: Positive for cough.   All other systems reviewed and are negative.       No past medical history on file.        Past Surgical History:  Procedure Laterality Date  . ABDOMINAL HYSTERECTOMY      . ORIF WRIST FRACTURE Left        No family history on file.   She denies any major family medical illnesses no anesthetic issues no bleeding issues Social History         Tobacco Use  . Smoking status: Current Every Day Smoker   Packs/day: 1.00      Years: 50.00      Pack years: 50.00      Types: Cigarettes  . Smokeless tobacco: Never Used  Substance Use Topics  . Alcohol use: Not Currently  . Drug use: Never         Allergies  Allergen Reactions  . Codeine        Gi upset        Active Medications  No outpatient medications have been marked as taking for the 07/15/17 encounter (Office Visit) with Vickki Hearing, MD.        BP (!) 147/82   Pulse 86   Ht  (1.727 m)   Wt 218 lb (98.9 kg)   BMI 33.15 kg/m    Physical Exam  Constitutional: She is oriented to person, place, and time. She appears well-developed and well-nourished.  Neurological: She is alert and oriented to person, place, and time.  Psychiatric: She has a normal mood and affect. Judgment normal.  Vitals reviewed.     Ortho Exam    Right upper extremity examined for comparison   Normal gross alignment full range  of motion no crepitation contracture.  Stability and strength test normal without subluxation or laxity.  No atrophy or abnormal movements such as tremor.  Skin warm dry and intact no palpable subcutaneous lesions.  Reflexes are intact pulses and perfusion are normal she is oriented x3 mood and affect are normal   Left upper extremity skin is intact previous incision is on the volar aspect of the wrist there is minimal swelling.  No joint dislocation subluxation no abnormal muscle tone grip strength is actually pretty good tender over the midshaft of the radius and ulna with normal alignment   Gait is normal

## 2017-07-16 NOTE — Transfer of Care (Signed)
Immediate Anesthesia Transfer of Care Note  Patient: Karla Christensen  Procedure(s) Performed: OPEN REDUCTION INTERNAL FIXATION (ORIF) RADIAL FRACTURE and ulna shaft//both bones forearm and hardware removal (Left Wrist) HARDWARE REMOVAL (Left Wrist)  Patient Location: PACU  Anesthesia Type:General  Level of Consciousness: awake, alert  and oriented  Airway & Oxygen Therapy: Patient Spontanous Breathing  Post-op Assessment: Report given to RN  Post vital signs: Reviewed and stable  Last Vitals:  Vitals Value Taken Time  BP 143/74 07/16/2017  3:15 PM  Temp    Pulse 93 07/16/2017  3:16 PM  Resp 23 07/16/2017  3:16 PM  SpO2 90 % 07/16/2017  3:16 PM  Vitals shown include unvalidated device data.  Last Pain:  Vitals:   07/16/17 1117  TempSrc: Oral  PainSc: 0-No pain      Patients Stated Pain Goal: 5 (07/16/17 1117)  Complications: No apparent anesthesia complications

## 2017-07-16 NOTE — Interval H&P Note (Signed)
History and Physical Interval Note:  07/16/2017 1:16 PM  Karla Christensen  has presented today for surgery, with the diagnosis of left both bone forearm fracture  The various methods of treatment have been discussed with the patient and family. After consideration of risks, benefits and other options for treatment, the patient has consented to  Procedure(s) with comments: OPEN REDUCTION INTERNAL FIXATION (ORIF) RADIAL FRACTURE and ulna shaft//both bones forearm and hardware removal (Left) - availabl 1300 hrs   need c arm  as a surgical intervention .  The patient's history has been reviewed, patient examined, no change in status, stable for surgery.  I have reviewed the patient's chart and labs.  Questions were answered to the patient's satisfaction.     [name]

## 2017-07-16 NOTE — Brief Op Note (Addendum)
07/16/2017  3:03 PM  PATIENT:  Karla Christensen  72 y.o. female  PRE-OPERATIVE DIAGNOSIS:  left both bone forearm fracture  POST-OPERATIVE DIAGNOSIS:  left both bone forearm fracture  PROCEDURE:  Procedure(s): OPEN REDUCTION INTERNAL FIXATION (ORIF) RADIAL FRACTURE and ulna shaft//both bones forearm and hardware removal (Left) HARDWARE REMOVAL (Left)   Synthes LCDCP 6 hole on the radius and ulna ; 1 of the screws is a locking screw the radial side, all of the other screws are cortical screws  We removed an old Synthes plate  Operative findings both bone forearm fracture left upper extremity forearm, the fracture on the radius was too close to the plate to leave the plate in place.  1 of the screws in the proximal portion of the plate was loose  Details of procedure the patient was identified in the preop area the surgical site was marked the chart was reviewed.  The patient was taken to surgery she had general anesthesia with intubation she was in the supine position the left hand and arm was on an armboard a tourniquet was on the left upper arm.  The arm was prepped and draped sterilely timeout was completed  The limb was exsanguinated with a 4 inch Esmarch the tourniquet was elevated to 250 mmHg  I marked out the old incision which seemed to be more midline and made a true volar approach of Sherilyn Cooter of the entire radius.  I divided subcutaneous tissue and then blunt dissection was carried out to the plate was exposed.  I used osteotomes to remove bone that had grown over the plate followed by removal of the screws and then removal of the plate  I then reduced the radius with 2 bone clamps and applied a slightly contoured plate using AO technique.  I applied 3 screws and then took x-ray x-rays showed reduction of the radius.  I then turned my attention to the ulna I made a dorsal approach of Thompson expose the ulna with subperiosteal dissection and then applied a plate starting on the  oblique side of the fracture and then loaded the opposite side and then took another x-ray both fractures were anatomically reduced I finished the screws on the ulna with a 6 hole plate and then turned my attention to the radius I did use 1 locking screw.  I use this screw in order to avoid opening another set as all of the screws in the 14 and 16 length had been used.  Final imaging study showed AP and lateral x-ray which showed reduction of the fracture anatomically hardware in good position and appropriate length  Wounds were irrigated and closed with 2-0 Monocryl in interrupted fashion followed by staples.  I did inject 15 cc on each incision using Marcaine.  Splint was applied volarly after the tourniquet was released  I got good color and capillary refill in the hand after the tourniquet was released  Postop plan Splint for 2 weeks At 2 weeks sutures come out staples come out x-rays are taken Forearm splint for the next 10 weeks  SURGEON:  Surgeon(s) and Role:    * Vickki Hearing, MD - Primary  PHYSICIAN ASSISTANT:   ASSISTANTS: Laureles Nation  ANESTHESIA:   general  EBL:  0 mL   BLOOD ADMINISTERED:none  DRAINS: none   LOCAL MEDICATIONS USED:  MARCAINE     SPECIMEN:  No Specimen  DISPOSITION OF SPECIMEN:  N/A  COUNTS:  YES  TOURNIQUET:  * Missing tourniquet times found for  documented tourniquets in log: [id number] *  DICTATION: .Dragon Dictation  PLAN OF CARE: Discharge to home after PACU  PATIENT DISPOSITION:  PACU - hemodynamically stable.   Delay start of Pharmacological VTE agent (>24hrs) due to surgical blood loss or risk of bleeding: not applicable

## 2017-07-19 ENCOUNTER — Encounter (HOSPITAL_COMMUNITY): Payer: Self-pay | Admitting: Orthopedic Surgery

## 2017-07-21 ENCOUNTER — Telehealth: Payer: Self-pay | Admitting: Orthopedic Surgery

## 2017-07-21 NOTE — Telephone Encounter (Signed)
Patient's daughter and one of designated contacts, Rickey Barbara called to confirm post op appointment, which I relayed is currently scheduled for Friday, 07/30/17.  Daughter understood that appointment was to be for this Friday, 07/23/17.  Please advise.  Ph [phone]

## 2017-07-22 DIAGNOSIS — Z8781 Personal history of (healed) traumatic fracture: Principal | ICD-10-CM

## 2017-07-22 DIAGNOSIS — Z9889 Other specified postprocedural states: Secondary | ICD-10-CM | POA: Insufficient documentation

## 2017-07-22 NOTE — Telephone Encounter (Signed)
Daughter, Clydie Braun was advised

## 2017-07-22 NOTE — Telephone Encounter (Signed)
Yes that is correct, she needs dressing change this Friday and appt next week also

## 2017-07-22 NOTE — Telephone Encounter (Signed)
Pt's daughter called again today stating she thought  Karla Christensen was to come in this Friday for dressing change and then see Dr. Romeo Apple next Friday for post op.  Please call pt's daughter Karla Braun at [phone] to advise.  Thanks

## 2017-07-23 ENCOUNTER — Ambulatory Visit (INDEPENDENT_AMBULATORY_CARE_PROVIDER_SITE_OTHER): Payer: Medicare Other | Admitting: Orthopedic Surgery

## 2017-07-23 VITALS — BP 135/86 | HR 81 | Ht 68.0 in | Wt 218.0 lb

## 2017-07-23 DIAGNOSIS — Z967 Presence of other bone and tendon implants: Secondary | ICD-10-CM

## 2017-07-23 DIAGNOSIS — Z9889 Other specified postprocedural states: Secondary | ICD-10-CM

## 2017-07-23 DIAGNOSIS — Z8781 Personal history of (healed) traumatic fracture: Secondary | ICD-10-CM | POA: Diagnosis not present

## 2017-07-23 DIAGNOSIS — S52302D Unspecified fracture of shaft of left radius, subsequent encounter for closed fracture with routine healing: Secondary | ICD-10-CM

## 2017-07-23 DIAGNOSIS — S52202D Unspecified fracture of shaft of left ulna, subsequent encounter for closed fracture with routine healing: Secondary | ICD-10-CM

## 2017-07-23 NOTE — Progress Notes (Signed)
POSTOP VISIT  POD # 7  Chief Complaint  Patient presents with  . Follow-up    Recheck on left forarm fracture, DOS 07-16-17.    Female both bone forearm fracture left upper extremity had ORIF 7 days ago came in for wound check splint change has no complaints no complaints of pain numbness tingling   Encounter Diagnoses  Name Primary?  . S/P ORIF (open reduction internal fixation) fracture left forearm 07/16/17 Yes  . Closed fracture of middle of left radius and ulna with routine healing, subsequent encounter     Wound was clean she can move her fingers pretty well the index fingers a little bit stiff at the metacarpophalangeal joint otherwise doing well new splint applied  Postoperative plan (Work, WB, No orders of the defined types were placed in this encounter. ,FU)  Work not initiated weightbearing not an issue follow-up 1 week x-rays out of the plaster and then application of forearm splint Velcro type

## 2017-07-30 ENCOUNTER — Ambulatory Visit (INDEPENDENT_AMBULATORY_CARE_PROVIDER_SITE_OTHER): Payer: Medicare Other

## 2017-07-30 ENCOUNTER — Ambulatory Visit (INDEPENDENT_AMBULATORY_CARE_PROVIDER_SITE_OTHER): Payer: Self-pay | Admitting: Orthopedic Surgery

## 2017-07-30 VITALS — BP 149/104 | HR 75 | Ht 68.0 in | Wt 218.0 lb

## 2017-07-30 DIAGNOSIS — S52202D Unspecified fracture of shaft of left ulna, subsequent encounter for closed fracture with routine healing: Secondary | ICD-10-CM

## 2017-07-30 DIAGNOSIS — Z8781 Personal history of (healed) traumatic fracture: Secondary | ICD-10-CM | POA: Diagnosis not present

## 2017-07-30 DIAGNOSIS — Z967 Presence of other bone and tendon implants: Secondary | ICD-10-CM

## 2017-07-30 DIAGNOSIS — Z9889 Other specified postprocedural states: Secondary | ICD-10-CM

## 2017-07-30 DIAGNOSIS — S52302D Unspecified fracture of shaft of left radius, subsequent encounter for closed fracture with routine healing: Secondary | ICD-10-CM

## 2017-07-30 NOTE — Progress Notes (Signed)
POSTOP VISIT  POD # 14  Chief Complaint  Patient presents with  . Follow-up    Recheck on left forearm fracture, DOS 07-16-17.    Postop day 14 left forearm fracture treated with ORIF  No complaints   Encounter Diagnoses  Name Primary?  . S/P ORIF (open reduction internal fixation) fracture left forearm 07/16/17 Yes  . Closed fracture of middle of left radius and ulna with routine healing, subsequent encounter     Staples taken out wounds look clean X-rays fixation stable no complications   Postoperative plan (Work, WB, No orders of the defined types were placed in this encounter. ,FU)  Follow-up in 4 weeks for x-ray  Remove dressing 1 week okay to shower  Wear brace AS  if it were a cast except for shower

## 2017-07-30 NOTE — Patient Instructions (Signed)
Remove dressings after 1 week and then you can take a shower  Wear the braces if it were a cast except for bathing  Continue to move the hand opening closing to make a tight fist to prevent stiffness

## 2017-08-30 ENCOUNTER — Ambulatory Visit (INDEPENDENT_AMBULATORY_CARE_PROVIDER_SITE_OTHER): Payer: Medicare Other | Admitting: Orthopedic Surgery

## 2017-08-30 ENCOUNTER — Ambulatory Visit (INDEPENDENT_AMBULATORY_CARE_PROVIDER_SITE_OTHER): Payer: Medicare Other

## 2017-08-30 ENCOUNTER — Encounter: Payer: Self-pay | Admitting: Orthopedic Surgery

## 2017-08-30 VITALS — BP 144/83 | HR 81 | Ht 68.0 in | Wt 212.0 lb

## 2017-08-30 DIAGNOSIS — Z967 Presence of other bone and tendon implants: Secondary | ICD-10-CM

## 2017-08-30 DIAGNOSIS — S52302D Unspecified fracture of shaft of left radius, subsequent encounter for closed fracture with routine healing: Secondary | ICD-10-CM | POA: Diagnosis not present

## 2017-08-30 DIAGNOSIS — S52202D Unspecified fracture of shaft of left ulna, subsequent encounter for closed fracture with routine healing: Secondary | ICD-10-CM

## 2017-08-30 DIAGNOSIS — Z8781 Personal history of (healed) traumatic fracture: Secondary | ICD-10-CM

## 2017-08-30 DIAGNOSIS — Z9889 Other specified postprocedural states: Secondary | ICD-10-CM

## 2017-08-30 NOTE — Progress Notes (Signed)
Postop visit  Postop day #45  Chief Complaint  Patient presents with  . Post-op Follow-up    ORIF left forearm 07/16/17    Status post open treatment internal fixation left both bones forearm fracture treated with ORIF plating  X-rays today show fracture is healing appropriately hardware is in good position without complications  The incisions healed well.  She has no neurovascular deficits.  She has no complaints  She is regained full range of motion  X-ray in 6 weeks

## 2017-10-18 ENCOUNTER — Ambulatory Visit (INDEPENDENT_AMBULATORY_CARE_PROVIDER_SITE_OTHER): Payer: Medicare Other

## 2017-10-18 ENCOUNTER — Ambulatory Visit (INDEPENDENT_AMBULATORY_CARE_PROVIDER_SITE_OTHER): Payer: Medicare Other | Admitting: Orthopedic Surgery

## 2017-10-18 VITALS — BP 131/80 | HR 92 | Ht 68.0 in | Wt 210.0 lb

## 2017-10-18 DIAGNOSIS — S52202D Unspecified fracture of shaft of left ulna, subsequent encounter for closed fracture with routine healing: Secondary | ICD-10-CM | POA: Diagnosis not present

## 2017-10-18 DIAGNOSIS — Z967 Presence of other bone and tendon implants: Secondary | ICD-10-CM | POA: Diagnosis not present

## 2017-10-18 DIAGNOSIS — Z9889 Other specified postprocedural states: Secondary | ICD-10-CM

## 2017-10-18 DIAGNOSIS — Z8781 Personal history of (healed) traumatic fracture: Secondary | ICD-10-CM

## 2017-10-18 DIAGNOSIS — S52302D Unspecified fracture of shaft of left radius, subsequent encounter for closed fracture with routine healing: Secondary | ICD-10-CM

## 2017-10-18 NOTE — Progress Notes (Signed)
POST OP APPOINTMENT   Chief Complaint  Patient presents with  . Follow-up    Recheck on left forearm fracture, DOS 07-16-17.   Pod 94   She has complaints of decreased sensation right at the end of the incision in the palm otherwise she is doing very well she is regained full range of motion in all planes her incisions are well-healed her x-ray looks great I released her  Encounter Diagnoses  Name Primary?  . S/P ORIF (open reduction internal fixation) fracture left forearm 07/16/17 Yes  . Closed fracture of middle of left radius and ulna with routine healing, subsequent encounter

## 2020-03-26 IMAGING — DX DG FOREARM 2V*L*
2 series · 2 of 2 positions shown · non-contrast
Comparison: None.

CLINICAL DATA: Left forearm pain after slip and fall in the shower.

EXAM:
LEFT FOREARM - 2 VIEW

[forearm ap]
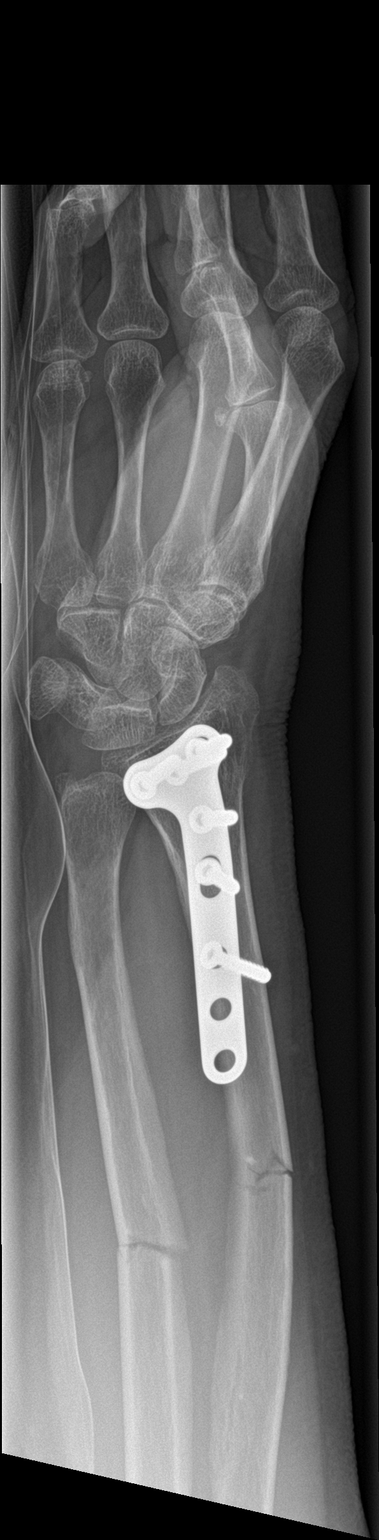

[forearm lat]
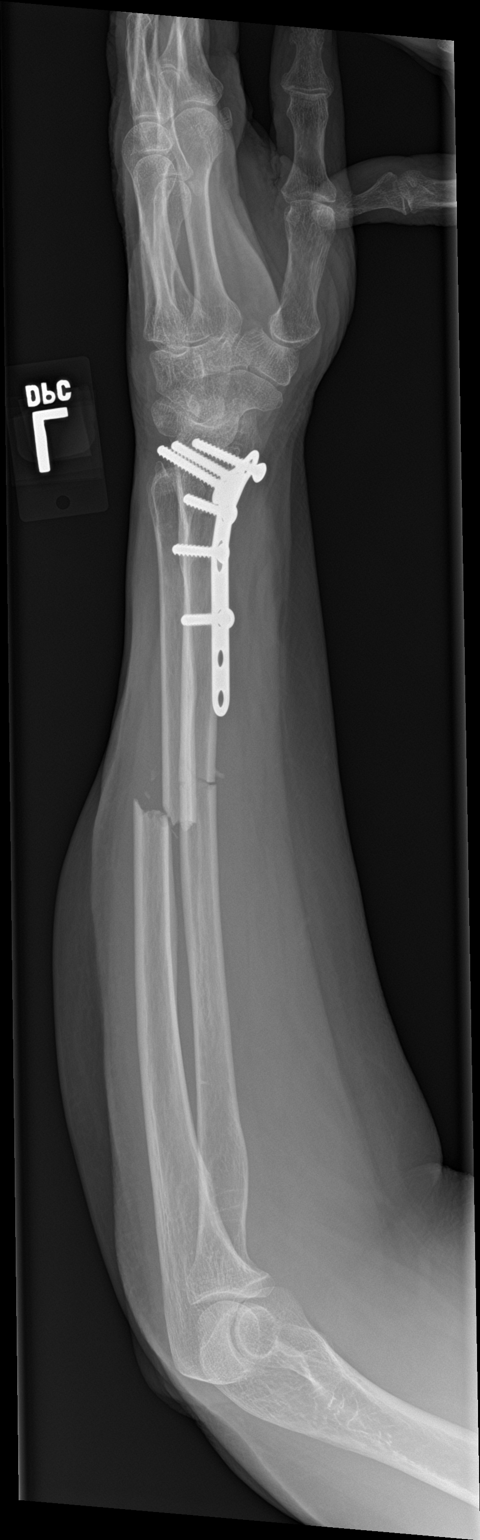

[2 of 2 positions shown; findings below may reference images not displayed]

FINDINGS: Acute, closed, transverse fracture through the mid ulnar diaphysis
with 1 shaft width volar displacement of the distal fracture
fragment and minimal comminution noted. Slight ulnar angulation of
the distal fracture fragment is also seen. A nondisplaced transverse
fracture through the distal radius at the junction of the middle and
distal third, approximately 18 mm from the proximal margin of a
volar fixation plate is also noted. No hardware loosening is
apparent. No joint dislocations.
IMPRESSION: Acute, closed, fractures of the mid ulna with 1 shaft with volar
displacement of the distal fracture fragment and slight ulnar
angulation.

Similarly there is a fracture involving the diaphysis of the distal
radius at the junction of the proximal middle third with slight
ulnar angulation of the distal fracture fragment.

A pre-existing volar plate and screw fixation is noted of the distal
radius without hardware failure nor fracture involvement identified.
